# Patient Record
Sex: Female | Born: 1970 | Race: White | Hispanic: No | Marital: Married
Health system: Southern US, Community
[De-identification: ages and names within clinical notes are randomized; demographics above are authoritative.]

---

## 2017-02-28 ENCOUNTER — Other Ambulatory Visit: Payer: Self-pay | Admitting: Neurosurgery

## 2017-02-28 DIAGNOSIS — M5412 Radiculopathy, cervical region: Secondary | ICD-10-CM

## 2017-03-31 ENCOUNTER — Ambulatory Visit
Admission: RE | Admit: 2017-03-31 | Discharge: 2017-03-31 | Disposition: A | Discharge status: Home / Self Care | Payer: 59 | Source: Ambulatory Visit | Attending: Neurosurgery | Admitting: Neurosurgery

## 2017-03-31 ENCOUNTER — Encounter: Payer: Self-pay | Admitting: Radiology

## 2017-03-31 DIAGNOSIS — M5412 Radiculopathy, cervical region: Secondary | ICD-10-CM

## 2018-12-25 ENCOUNTER — Encounter: Payer: Self-pay | Admitting: *Deleted

## 2018-12-25 NOTE — Progress Notes (Unsigned)
Came into the office to pick up inserts for son, screened for Corvid-19 virus,temp. Taken,questionaire answered.

## 2020-08-07 ENCOUNTER — Other Ambulatory Visit: Payer: Self-pay | Admitting: Obstetrics and Gynecology

## 2020-08-07 DIAGNOSIS — R928 Other abnormal and inconclusive findings on diagnostic imaging of breast: Secondary | ICD-10-CM

## 2020-08-10 ENCOUNTER — Ambulatory Visit
Admission: RE | Admit: 2020-08-10 | Discharge: 2020-08-10 | Disposition: A | Discharge status: Home / Self Care | Payer: PRIVATE HEALTH INSURANCE | Source: Ambulatory Visit | Attending: Obstetrics and Gynecology | Admitting: Obstetrics and Gynecology

## 2020-08-10 DIAGNOSIS — R928 Other abnormal and inconclusive findings on diagnostic imaging of breast: Secondary | ICD-10-CM

## 2022-01-16 IMAGING — US US BREAST*L* LIMITED INC AXILLA
1 series · 9 of 9 positions shown · non-contrast
Comparison: Previous exam(s).

CLINICAL DATA: Patient returns after screening for evaluation of
possible LEFT breast mass.

EXAM:
ULTRASOUND OF THE LEFT BREAST

[Series 1: us breast*left* limited inc axilla · 0.05mm/px · 9 of 9 slices shown]
[im 1/9]
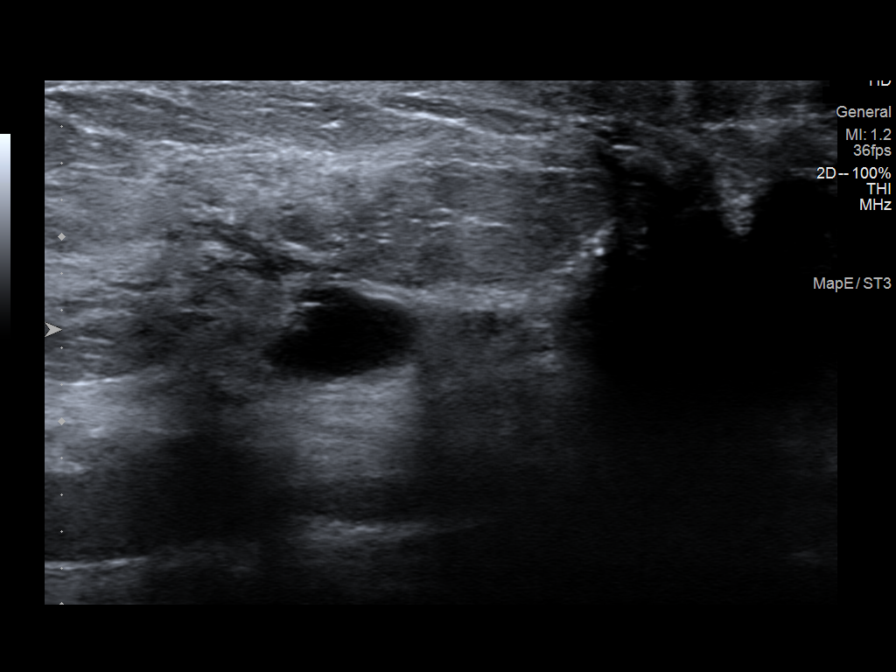
[im 2/9]
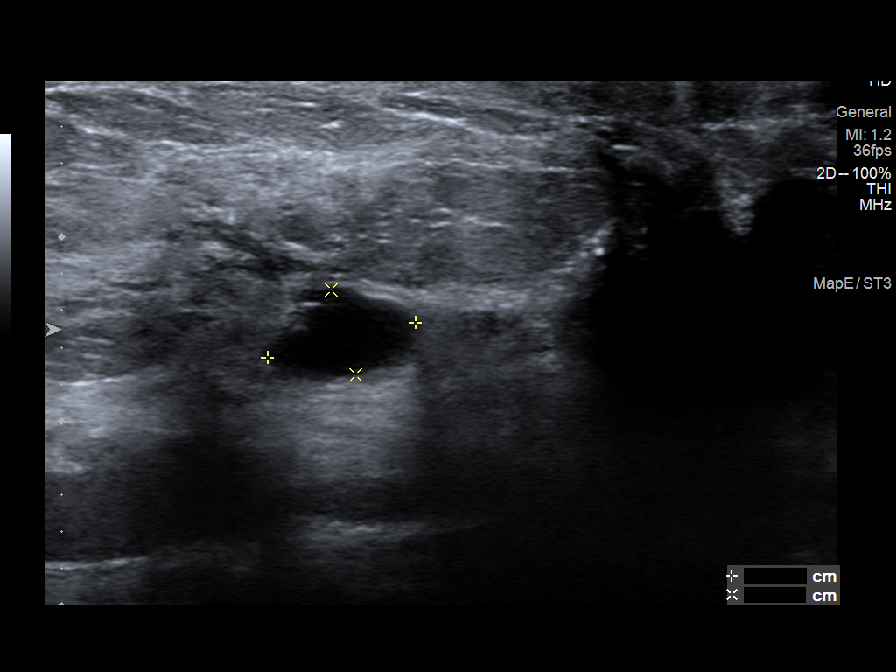
[im 3/9]
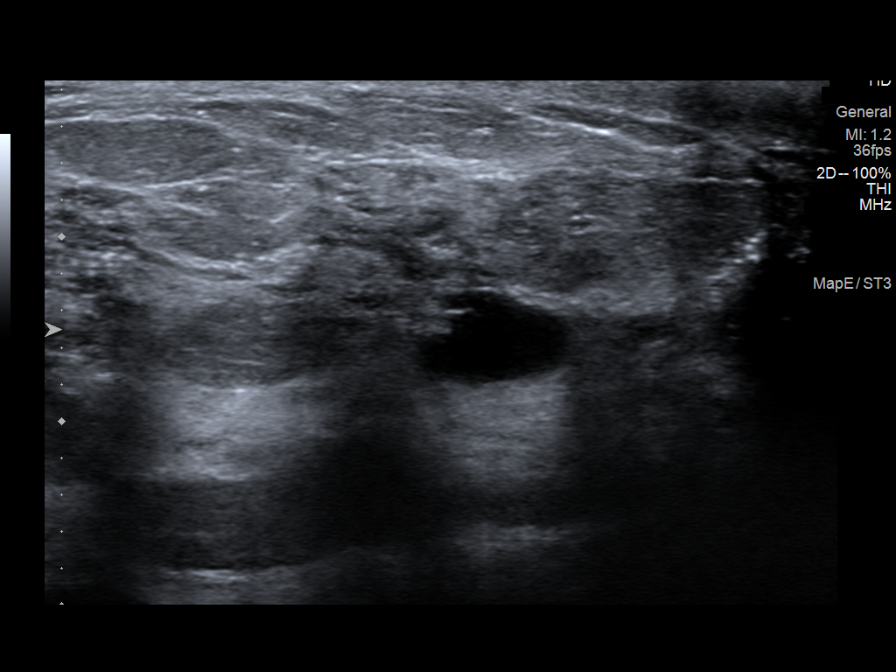
[im 4/9]
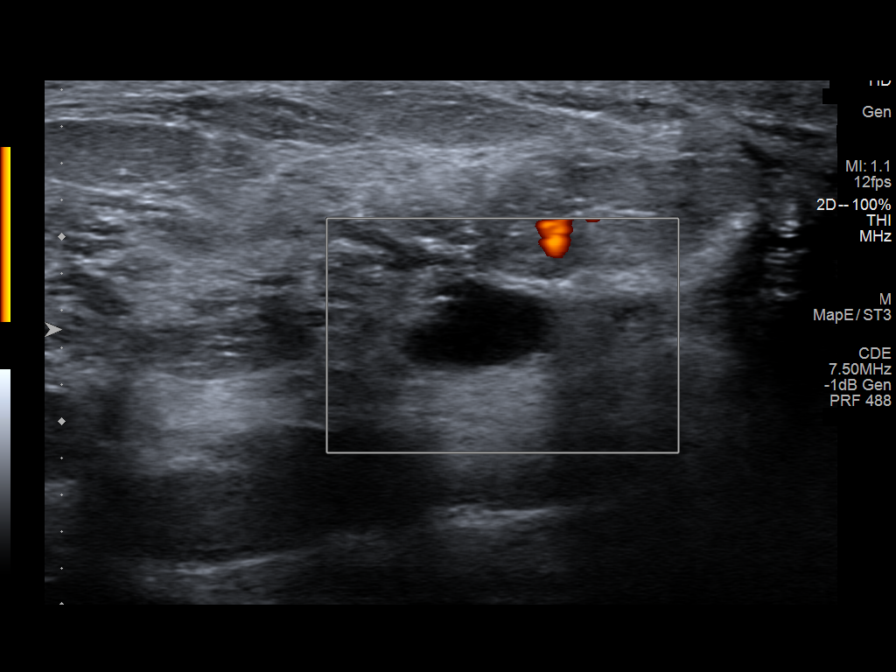
[im 5/9]
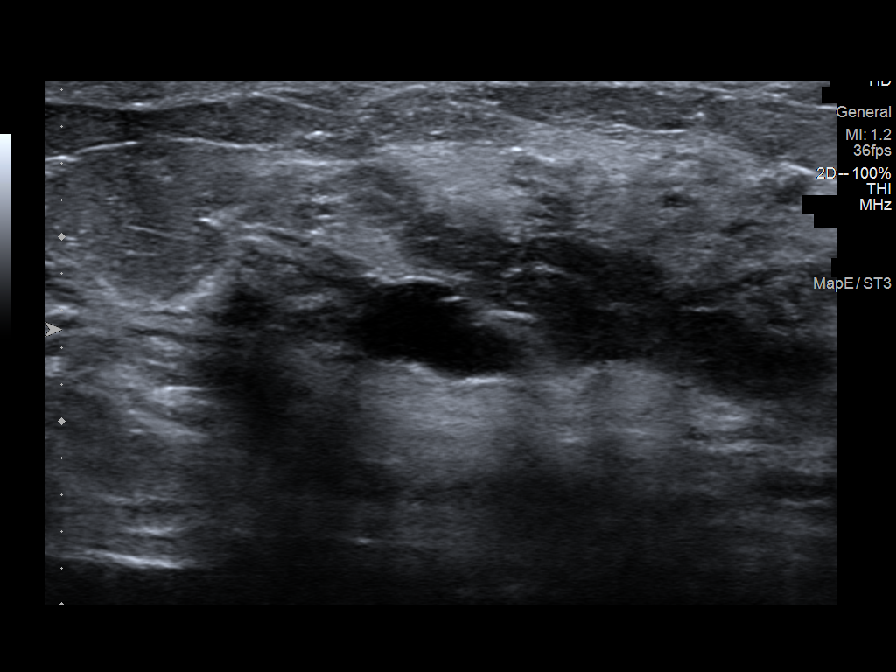
[im 6/9]
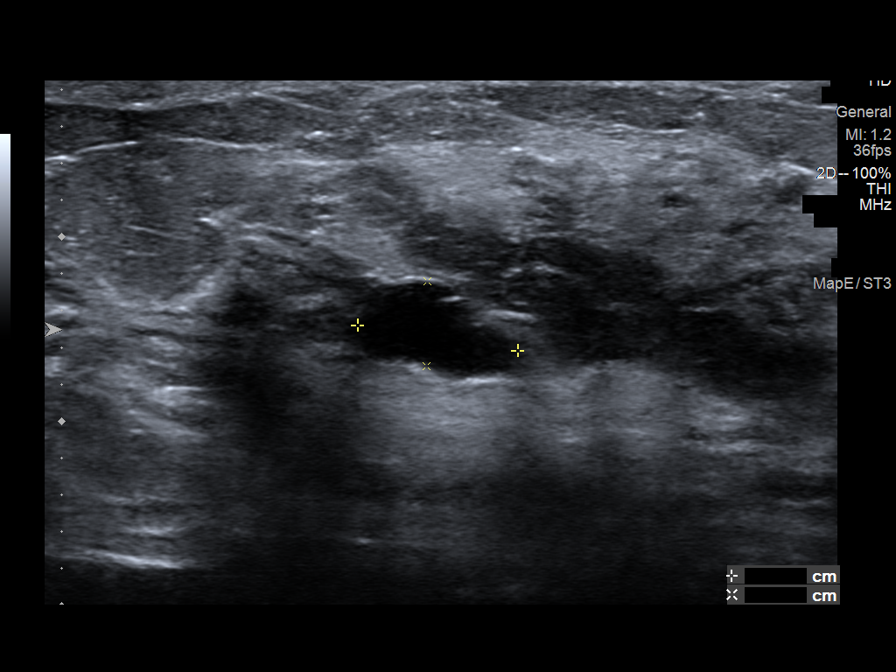
[im 7/9]
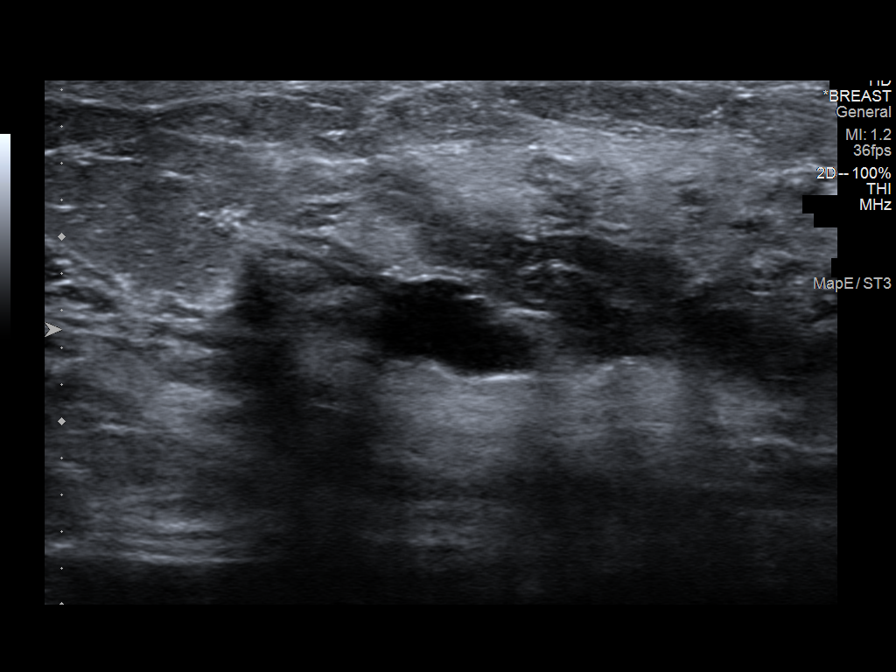
[im 8/9]
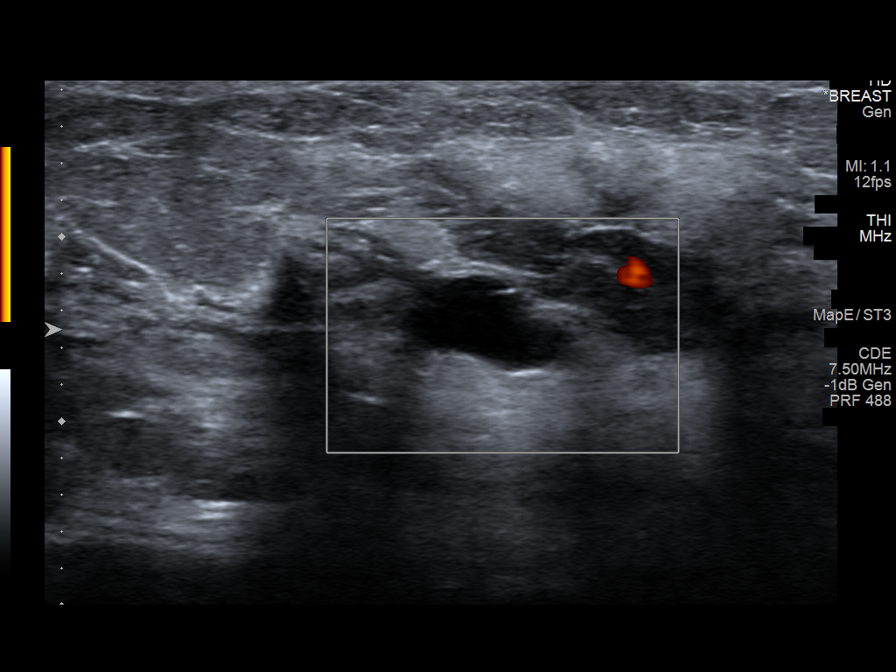
[im 9/9]
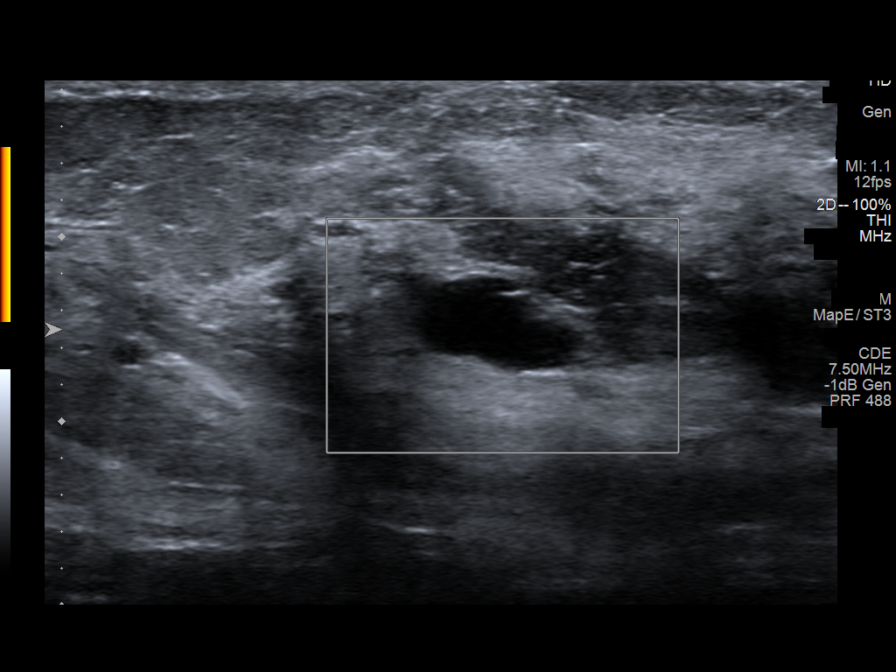

[9 of 9 positions shown; findings below may reference images not displayed]

FINDINGS: Targeted ultrasound is performed, showing a simple cyst in the
retroareolar 12 o'clock location of the LEFT breast which measures
0.8 x 0.5 x 0.9 centimeters. Cyst correlates well with the
mammographic appearance and is benign.
IMPRESSION: Benign LEFT breast cyst. There is no sonographic evidence for
malignancy.

RECOMMENDATION:
Screening mammogram in one year.(Code:[id number])

I have discussed the findings and recommendations with the patient.
If applicable, a reminder letter will be sent to the patient
regarding the next appointment.

BI-RADS CATEGORY  1: Negative.
